# Patient Record
Sex: Female | Born: 2000
Health system: Southern US, Community
[De-identification: ages and names within clinical notes are randomized; demographics above are authoritative.]

## PROBLEM LIST (undated history)

## (undated) DIAGNOSIS — F419 Anxiety disorder, unspecified: Secondary | ICD-10-CM

## (undated) HISTORY — DX: Anxiety disorder, unspecified: F41.9

## (undated) HISTORY — PX: TONSILLECTOMY: SUR1361

---
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
## 2022-01-03 IMAGING — CR DG CHEST 2V
1 series · 2 of 2 positions shown · non-contrast
Comparison: None.

CLINICAL DATA: Ongoing cough.

EXAM:
CHEST - 2 VIEW

[Series 1: dg chest 2 view · 0.14mm/px · 2 of 2 slices shown]
[im 1/2]
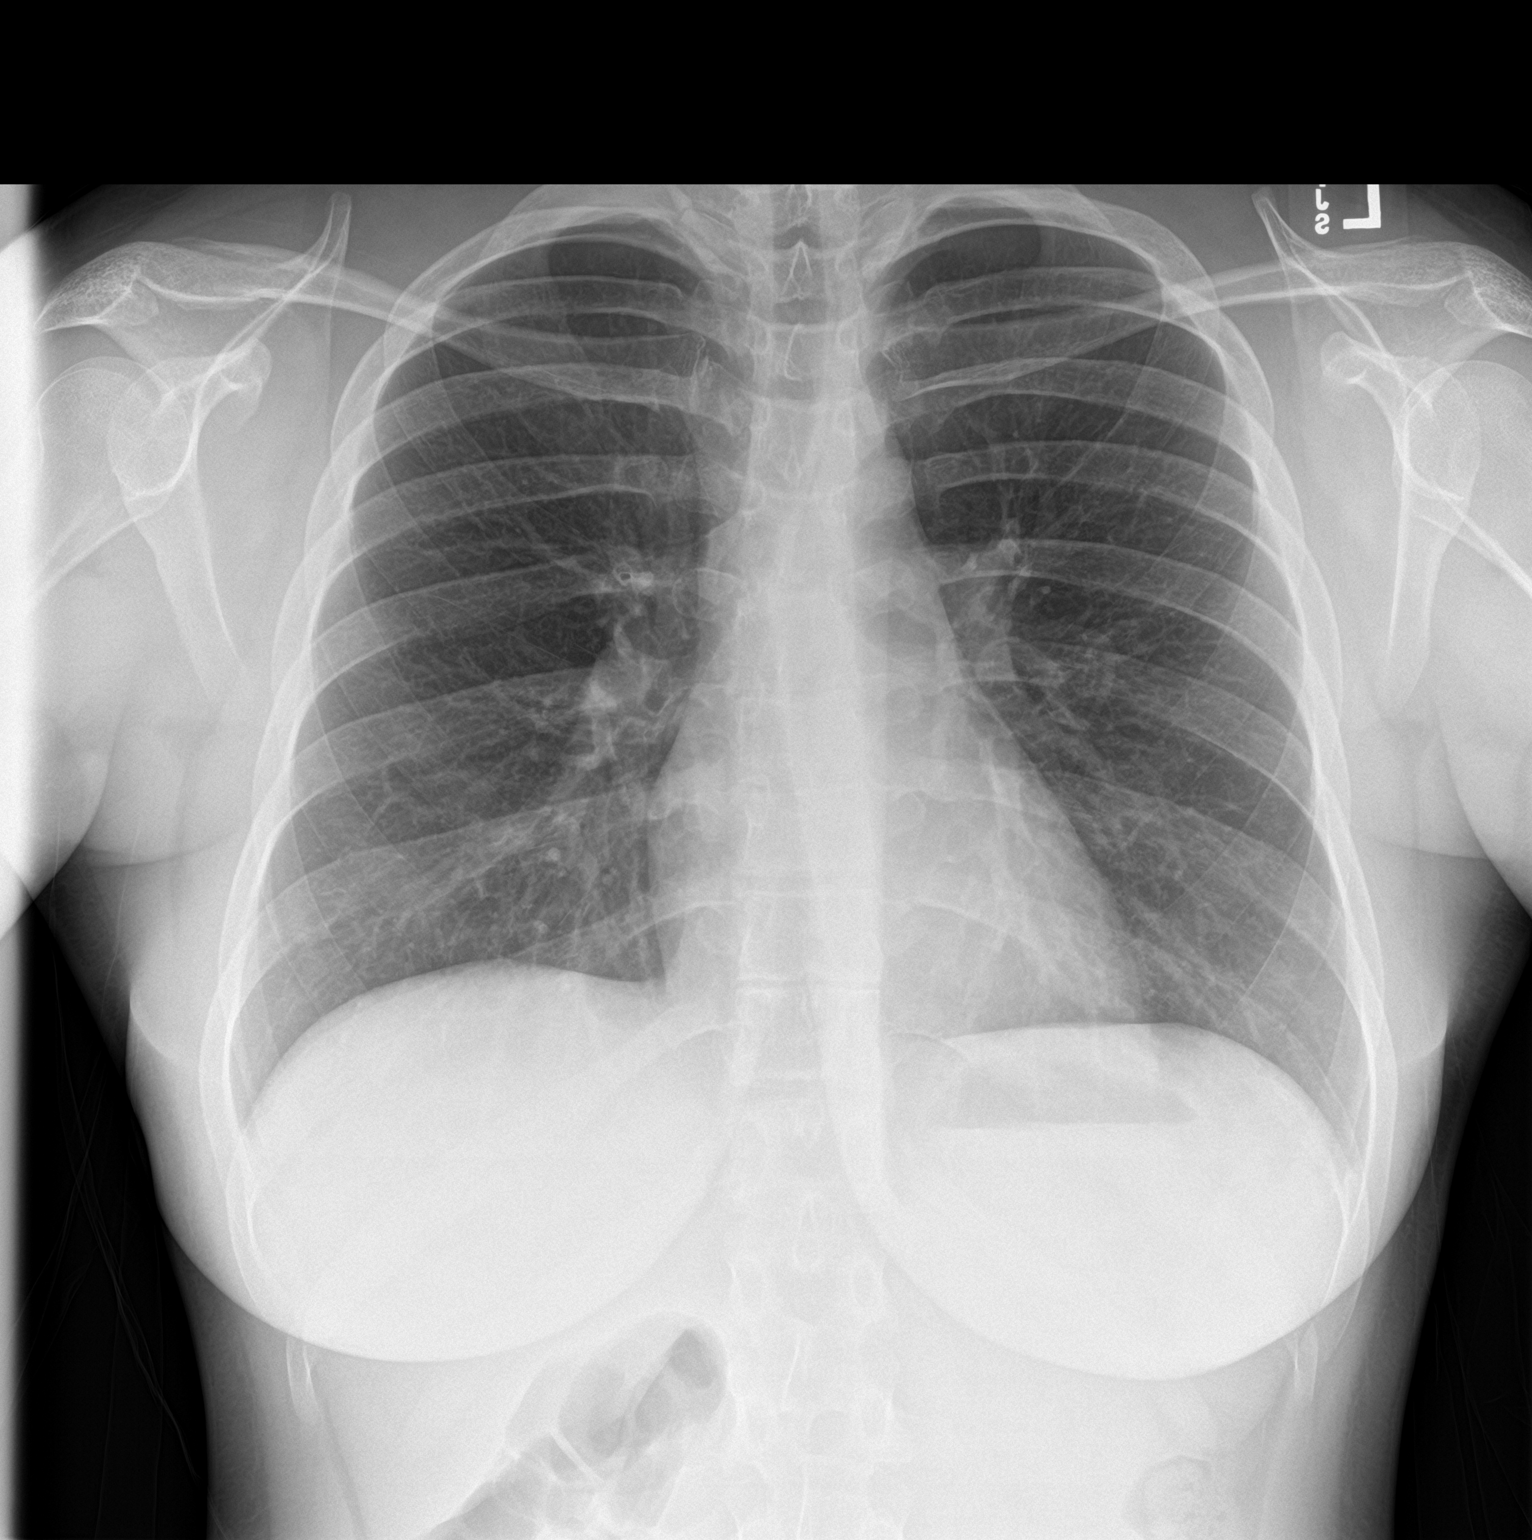
[im 2/2]
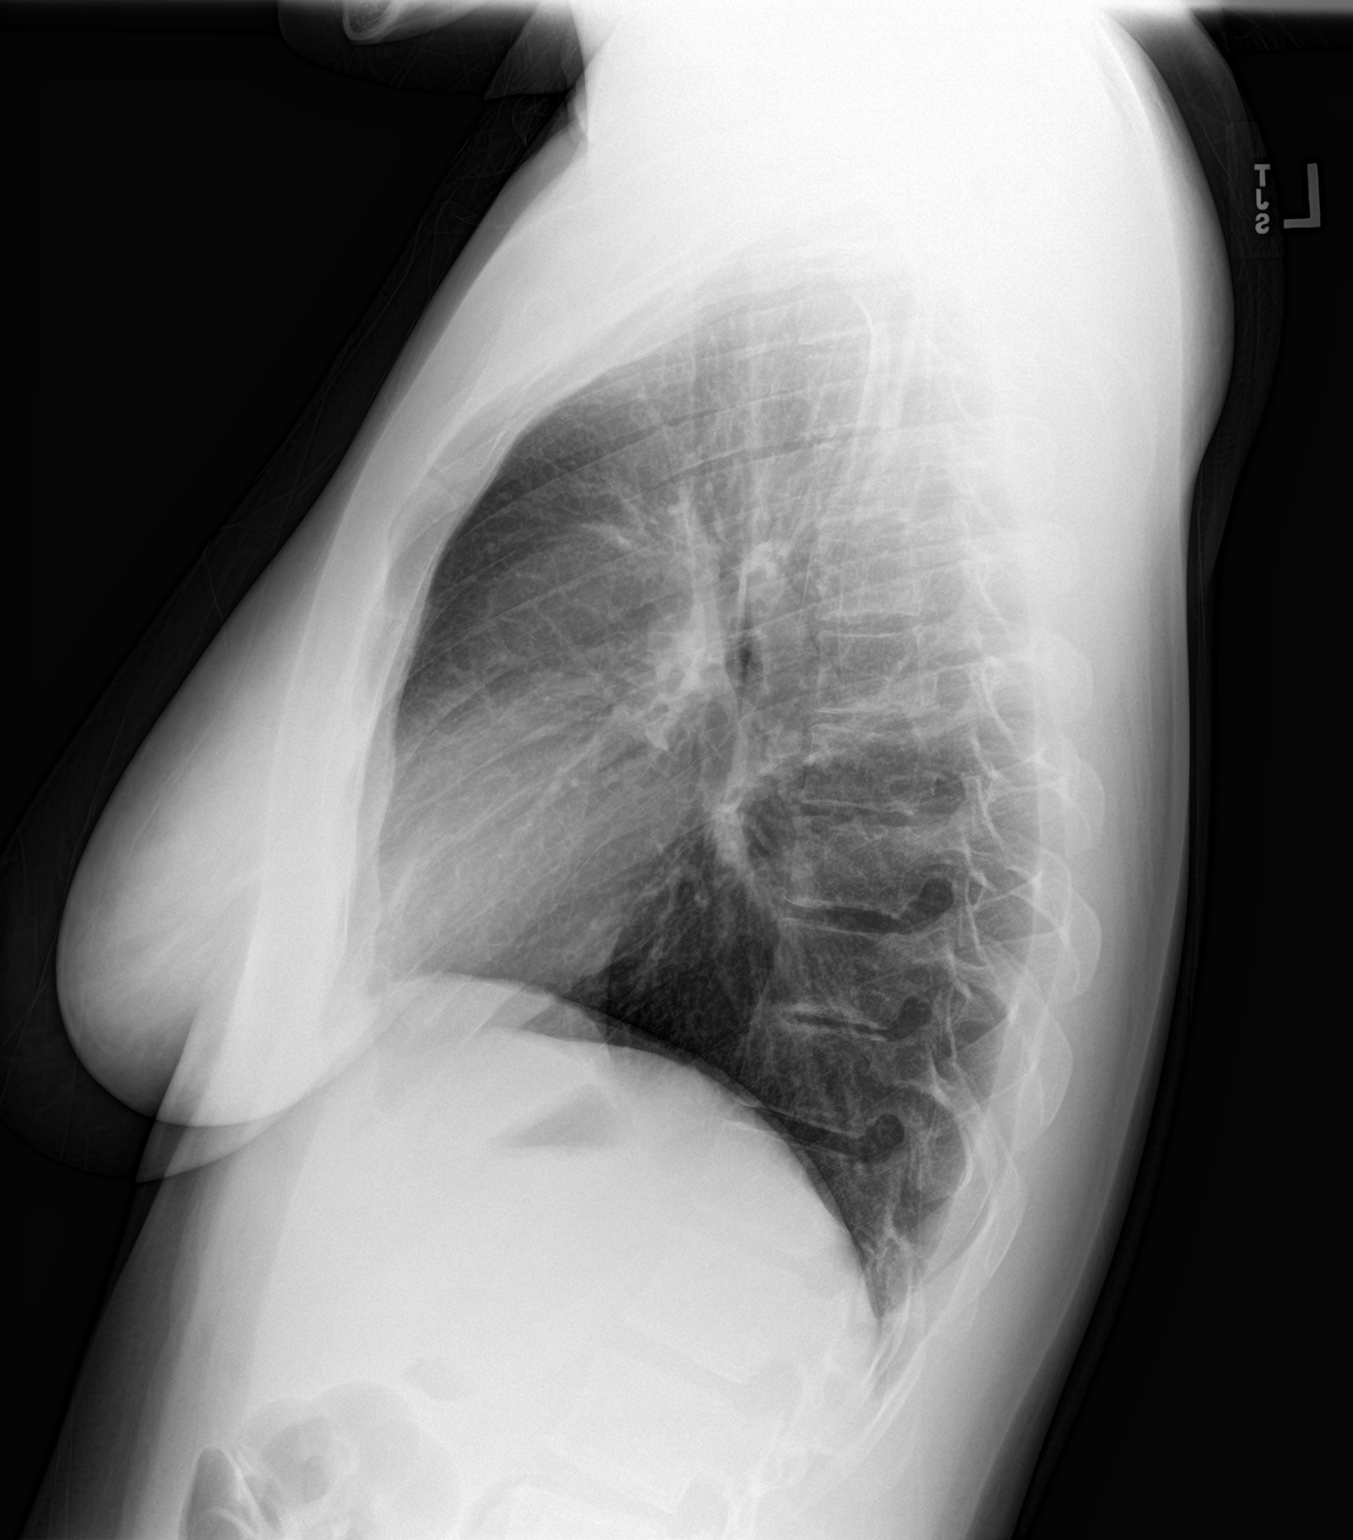

[2 of 2 positions shown; findings below may reference images not displayed]

FINDINGS: The heart size and mediastinal contours are within normal limits.
Both lungs are clear. The visualized skeletal structures are
unremarkable.
IMPRESSION: No active cardiopulmonary disease.
